# Patient Record
Sex: Male | Born: 1960 | Race: Black or African American | Hispanic: No | Marital: Single
Health system: Southern US, Community
[De-identification: ages and names within clinical notes are randomized; demographics above are authoritative.]

---

## 2016-04-08 ENCOUNTER — Emergency Department
Admission: EM | Admit: 2016-04-08 | Discharge: 2016-04-08 | Disposition: A | Discharge status: Home / Self Care | Payer: No Typology Code available for payment source | Attending: Emergency Medicine | Admitting: Emergency Medicine

## 2016-04-08 ENCOUNTER — Emergency Department: Payer: No Typology Code available for payment source

## 2016-04-08 ENCOUNTER — Encounter: Payer: Self-pay | Admitting: *Deleted

## 2016-04-08 DIAGNOSIS — Y999 Unspecified external cause status: Secondary | ICD-10-CM | POA: Insufficient documentation

## 2016-04-08 DIAGNOSIS — Y92410 Unspecified street and highway as the place of occurrence of the external cause: Secondary | ICD-10-CM | POA: Diagnosis not present

## 2016-04-08 DIAGNOSIS — S3992XA Unspecified injury of lower back, initial encounter: Secondary | ICD-10-CM | POA: Diagnosis present

## 2016-04-08 DIAGNOSIS — M545 Low back pain: Secondary | ICD-10-CM | POA: Diagnosis not present

## 2016-04-08 DIAGNOSIS — Y9389 Activity, other specified: Secondary | ICD-10-CM | POA: Diagnosis not present

## 2016-04-08 DIAGNOSIS — M546 Pain in thoracic spine: Secondary | ICD-10-CM | POA: Diagnosis not present

## 2016-04-08 MED ORDER — CYCLOBENZAPRINE HCL 5 MG PO TABS: 5.0000 mg | ORAL_TABLET | Freq: Three times a day (TID) | ORAL | 0 refills | Status: AC | PRN

## 2016-04-08 MED ORDER — MELOXICAM 7.5 MG PO TABS
7.5000 mg | ORAL_TABLET | Freq: Every day | ORAL | 1 refills | Status: AC
Start: 2016-04-08 — End: 2016-04-15

## 2016-04-08 MED ORDER — KETOROLAC TROMETHAMINE 60 MG/2ML IM SOLN
60.0000 mg | Freq: Once | INTRAMUSCULAR | Status: AC
Start: 2016-04-08 — End: 2016-04-08
  Administered 2016-04-08: 60 mg via INTRAMUSCULAR
  Filled 2016-04-08: qty 2
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
